# Patient Record
Sex: Male | Born: 1985 | Race: Black or African American | Hispanic: No | Marital: Married | Smoking: Former smoker
Health system: Southern US, Community
[De-identification: ages and names within clinical notes are randomized; demographics above are authoritative.]

## PROBLEM LIST (undated history)

## (undated) DIAGNOSIS — F4310 Post-traumatic stress disorder, unspecified: Secondary | ICD-10-CM

## (undated) DIAGNOSIS — I82409 Acute embolism and thrombosis of unspecified deep veins of unspecified lower extremity: Secondary | ICD-10-CM

## (undated) HISTORY — PX: OTHER SURGICAL HISTORY: SHX169

## (undated) HISTORY — PX: EXTERNAL EAR SURGERY: SHX627

## (undated) HISTORY — PX: TENDON REPAIR: SHX5111

---
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
## 2009-12-16 ENCOUNTER — Inpatient Hospital Stay (HOSPITAL_COMMUNITY): Admission: EM | Admit: 2009-12-16 | Discharge: 2009-12-17 | Payer: Self-pay | Admitting: Emergency Medicine

## 2010-01-23 ENCOUNTER — Encounter: Admission: RE | Admit: 2010-01-23 | Discharge: 2010-02-22 | Payer: Self-pay | Admitting: Orthopedic Surgery

## 2010-09-16 LAB — COMPREHENSIVE METABOLIC PANEL
Alkaline Phosphatase: 71 U/L (ref 39–117)
BUN: 12 mg/dL (ref 6–23)
Calcium: 9.6 mg/dL (ref 8.4–10.5)
Chloride: 105 mEq/L (ref 96–112)
Creatinine, Ser: 1 mg/dL (ref 0.4–1.5)
GFR calc Af Amer: >60 mL/min (ref >60)
Potassium: 3.2 mEq/L — ABNORMAL LOW (ref 3.5–5.1)

## 2010-09-16 LAB — CBC
HCT: 41.1 % (ref 39.0–52.0)
Hemoglobin: 14.2 g/dL (ref 13.0–17.0)
MCHC: 34.6 g/dL (ref 30.0–36.0)
RDW: 12.7 % (ref 11.5–15.5)
WBC: 6.3 10*3/uL (ref 4.0–10.5)

## 2010-09-16 LAB — POCT I-STAT, CHEM 8
BUN: 14 mg/dL (ref 6–23)
Calcium, Ion: 1.17 mmol/L (ref 1.12–1.32)
Chloride: 105 mEq/L (ref 96–112)
Glucose, Bld: 90 mg/dL (ref 70–99)
Hemoglobin: 13.9 g/dL (ref 13.0–17.0)
Sodium: 141 mEq/L (ref 135–145)

## 2011-07-10 ENCOUNTER — Ambulatory Visit (INDEPENDENT_AMBULATORY_CARE_PROVIDER_SITE_OTHER): Payer: BC Managed Care – PPO

## 2011-07-10 DIAGNOSIS — R05 Cough: Secondary | ICD-10-CM

## 2011-07-10 DIAGNOSIS — R509 Fever, unspecified: Secondary | ICD-10-CM

## 2011-07-10 DIAGNOSIS — R059 Cough, unspecified: Secondary | ICD-10-CM

## 2011-10-04 ENCOUNTER — Encounter (HOSPITAL_COMMUNITY): Payer: Self-pay | Admitting: *Deleted

## 2011-10-04 ENCOUNTER — Emergency Department (HOSPITAL_COMMUNITY)
Admission: EM | Admit: 2011-10-04 | Discharge: 2011-10-04 | Disposition: A | Discharge status: Home / Self Care | Payer: BC Managed Care – PPO | Attending: Emergency Medicine | Admitting: Emergency Medicine

## 2011-10-04 ENCOUNTER — Emergency Department (HOSPITAL_COMMUNITY): Payer: BC Managed Care – PPO

## 2011-10-04 DIAGNOSIS — R071 Chest pain on breathing: Secondary | ICD-10-CM | POA: Insufficient documentation

## 2011-10-04 DIAGNOSIS — R0789 Other chest pain: Secondary | ICD-10-CM

## 2011-10-04 MED ORDER — OXYCODONE-ACETAMINOPHEN 5-325 MG PO TABS: 1.0000 | ORAL_TABLET | Freq: Four times a day (QID) | ORAL | Status: AC | PRN

## 2011-10-04 MED ORDER — ONDANSETRON 8 MG PO TBDP: 8.0000 mg | ORAL_TABLET | Freq: Three times a day (TID) | ORAL | Status: AC | PRN

## 2011-10-04 MED ORDER — METAXALONE 800 MG PO TABS: 800.0000 mg | ORAL_TABLET | Freq: Three times a day (TID) | ORAL | Status: AC

## 2011-10-04 MED ORDER — OXYCODONE-ACETAMINOPHEN 5-325 MG PO TABS
1.0000 | ORAL_TABLET | Freq: Once | ORAL | Status: AC
  Administered 2011-10-04: 1 via ORAL
  Filled 2011-10-04: qty 1

## 2011-10-04 NOTE — Discharge Instructions (Signed)
Chest Wall Pain Chest wall pain is pain in or around the bones and muscles of your chest. It may take up to 6 weeks to get better. It may take longer if you must stay physically active in your work and activities.  CAUSES  Chest wall pain may happen on its own. However, it may be caused by:  A viral illness like the flu.   Injury.   Coughing.   Exercise.   Arthritis.   Fibromyalgia.   Shingles.  HOME CARE INSTRUCTIONS   Avoid overtiring physical activity. Try not to strain or perform activities that cause pain. This includes any activities using your chest or your abdominal and side muscles, especially if heavy weights are used.   Put ice on the sore area.   Put ice in a plastic bag.   Place a towel between your skin and the bag.   Leave the ice on for 15 to 20 minutes per hour while awake for the first 2 days.   Only take over-the-counter or prescription medicines for pain, discomfort, or fever as directed by your caregiver.  SEEK IMMEDIATE MEDICAL CARE IF:   Your pain increases, or you are very uncomfortable.   You have a fever.   Your chest pain becomes worse.   You have new, unexplained symptoms.   You have nausea or vomiting.   You feel sweaty or lightheaded.   You have a cough with phlegm (sputum), or you cough up blood.  MAKE SURE YOU:   Understand these instructions.   Will watch your condition.   Will get help right away if you are not doing well or get worse.  Document Released: 06/17/2005 Document Revised: 06/06/2011 Document Reviewed: 02/11/2011 [facility] Patient Information 2012 [place], [place]. Take medication as directed follow at the [place] as needed

## 2011-10-04 NOTE — ED Notes (Signed)
Pt returned from xray

## 2011-10-04 NOTE — ED Provider Notes (Addendum)
History     CSN: [id number]  Arrival date & time 10/04/11  0226   First MD Initiated Contact with Patient 10/04/11 [phone]      Chief Complaint  Patient presents with  . Chest Pain    Pain on Lt side of chest that increases with movement.     (Consider location/radiation/quality/duration/timing/severity/associated sxs/prior treatment) HPI Comments: Darrell [name] is a 26 year old, tall, thin gentleman for an acute onset of left rib pain.  Approximately 2 weeks ago.  That has been persistent, since he's been taking over-the-counter ibuprofen and muscle relaxers, that he's had left over from a previous injury without any relief.  The pain is sharp in nature, stabbing, made worse with breathing.  Certain positions make tolerable with splinting and being still  Patient is a 26 y.o. male presenting with chest pain. The history is provided by the patient.  Chest Pain The chest pain began 1 - 2 weeks ago. Chest pain occurs constantly. The chest pain is worsening. The pain is associated with breathing, lifting and exertion. At its most intense, the pain is at 9/10. The pain is currently at 8/10. The severity of the pain is moderate. The quality of the pain is described as sharp. The pain does not radiate. Pertinent negatives for primary symptoms include no fever, no cough, no nausea, no vomiting and no dizziness.     History reviewed. No pertinent past medical history.  History reviewed. No pertinent past surgical history.  History reviewed. No pertinent family history.  History  Substance Use Topics  . Smoking status: Not on file  . Smokeless tobacco: Not on file  . Alcohol Use: Not on file      Review of Systems  Constitutional: Negative for fever.  HENT: Negative for rhinorrhea.   Respiratory: Negative for cough.   Cardiovascular: Positive for chest pain.  Gastrointestinal: Negative for nausea and vomiting.  Genitourinary: Negative for flank pain.  Musculoskeletal: Negative for  myalgias and back pain.  Neurological: Negative for dizziness and headaches.    Allergies  Review of patient's allergies indicates no known allergies.  Home Medications   Current Outpatient Rx  Name Route Sig Dispense Refill  . IBUPROFEN 600 MG PO TABS Oral Take 600 mg by mouth every 6 (six) hours as needed. For pain    . PRESCRIPTION MEDICATION Oral Take 1 tablet by mouth once. Muscle relaxer for an old back injury-he has 3 different muscle relaxers, but not sure which one he took.    . SERTRALINE HCL 100 MG PO TABS Oral Take 100 mg by mouth 2 (two) times daily. For PTSD    . METAXALONE 800 MG PO TABS Oral Take 1 tablet (800 mg total) by mouth 3 (three) times daily. 21 tablet 0  . ONDANSETRON 8 MG PO TBDP Oral Take 1 tablet (8 mg total) by mouth every 8 (eight) hours as needed for nausea. 20 tablet 0  . OXYCODONE-ACETAMINOPHEN 5-325 MG PO TABS Oral Take 1 tablet by mouth every 6 (six) hours as needed for pain. 20 tablet 0    BP 116/81  Pulse 80  Temp(Src) 97.9 F (36.6 C) (Oral)  Resp 20  SpO2 97%  Physical Exam  Constitutional: He appears well-developed and well-nourished.  HENT:  Head: Normocephalic.  Eyes: Pupils are equal, round, and reactive to light.  Neck: Normal range of motion.  Cardiovascular: Normal rate and regular rhythm.   Pulmonary/Chest: Effort normal and breath sounds normal. He has no wheezes. He exhibits tenderness.  Abdominal: Soft.  Musculoskeletal: Normal range of motion.  Neurological: He is alert.    ED Course  Procedures (including critical care time)  Labs Reviewed - No data to display Dg Chest 2 View  10/04/2011  *RADIOLOGY REPORT*  Clinical Data: Left flank pain for 1 week.  Pain on breathing.  CHEST - 2 VIEW  Comparison: CT chest 12/16/2009  Findings: The heart size and pulmonary vascularity are normal. The lungs appear clear and expanded without focal air space disease or consolidation. No blunting of the costophrenic angles.  No pneumothorax.   IMPRESSION: No evidence of active pulmonary disease.  Original Report Authenticated By: Marlon Pel, M.D.     1. Left-sided chest wall pain       MDM  2 patient's physical stature, concerning for a spontaneous pneumothorax, will be evaluated by chest x-ray, although he is not tachycardic or tachypnea or hypoxic        Arman Filter, NP 10/04/11 0506  Arman Filter, NP 10/04/11 0506  Arman Filter, NP 12/03/11 9604

## 2011-10-04 NOTE — ED Provider Notes (Signed)
Medical screening examination/treatment/procedure(s) were performed by non-physician practitioner and as supervising physician I was immediately available for consultation/collaboration.   Olivia Mackie, MD 10/04/11 [phone]

## 2011-10-04 NOTE — ED Notes (Signed)
Rx x 1, pt voiced understanding to f/u with PCP. 

## 2011-10-04 NOTE — ED Notes (Signed)
Pt c/o ongoing left sided CP x 1 week, states the pain has increased each day.  Denies any known injury.  Pain increases with inspiration and palpation.  No crepitus/free air noted.  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

## 2011-10-04 NOTE — ED Notes (Signed)
PT reports Lt sided rib pain that is worse with breathing. Pt denies injury to area ,no bruising noted. Pt does report SHOB and increased pain with  Breathing. Pain is sharp .

## 2011-10-16 ENCOUNTER — Encounter (HOSPITAL_COMMUNITY): Payer: Self-pay

## 2011-10-16 ENCOUNTER — Emergency Department (HOSPITAL_COMMUNITY)
Admission: EM | Admit: 2011-10-16 | Discharge: 2011-10-16 | Disposition: A | Discharge status: Home / Self Care | Payer: BC Managed Care – PPO | Source: Home / Self Care | Attending: Family Medicine | Admitting: Family Medicine

## 2011-10-16 DIAGNOSIS — K612 Anorectal abscess: Secondary | ICD-10-CM

## 2011-10-16 DIAGNOSIS — K611 Rectal abscess: Secondary | ICD-10-CM

## 2011-10-16 HISTORY — DX: Post-traumatic stress disorder, unspecified: F43.10

## 2011-10-16 MED ORDER — SULFAMETHOXAZOLE-TRIMETHOPRIM 800-160 MG PO TABS: 1.0000 | ORAL_TABLET | Freq: Two times a day (BID) | ORAL | Status: AC

## 2011-10-16 NOTE — ED Notes (Signed)
rx for bactrim ds called in to [facility], [address] at pt request, spoke directly w pharmacist to inform of pt pending arrival

## 2011-10-16 NOTE — Discharge Instructions (Signed)
Warm compress twice a day when you take the antibiotic, take all of medicine, return as needed. °

## 2011-10-16 NOTE — ED Provider Notes (Signed)
History     CSN: [id number]  Arrival date & time 10/16/11  9629   First MD Initiated Contact with Patient 10/16/11 1932      Chief Complaint  Patient presents with  . Recurrent Skin Infections    (Consider location/radiation/quality/duration/timing/severity/associated sxs/prior treatment) Patient is a 26 y.o. male presenting with abscess. The history is provided by the patient.  Abscess  This is a new problem. The current episode started less than one week ago. The problem has been gradually worsening. The problem is moderate. The abscess is characterized by painfulness.    Past Medical History  Diagnosis Date  . PTSD (post-traumatic stress disorder)     History reviewed. No pertinent past surgical history.  History reviewed. No pertinent family history.  History  Substance Use Topics  . Smoking status: Not on file  . Smokeless tobacco: Not on file  . Alcohol Use:       Review of Systems  Constitutional: Negative.   Gastrointestinal: Positive for rectal pain.    Allergies  Review of patient's allergies indicates no known allergies.  Home Medications   Current Outpatient Rx  Name Route Sig Dispense Refill  . METRONIDAZOLE 250 MG PO TABS Oral Take 250 mg by mouth 3 (three) times daily.    . IBUPROFEN 600 MG PO TABS Oral Take 600 mg by mouth every 6 (six) hours as needed. For pain    . PRESCRIPTION MEDICATION Oral Take 1 tablet by mouth once. Muscle relaxer for an old back injury-he has 3 different muscle relaxers, but not sure which one he took.    . SERTRALINE HCL 100 MG PO TABS Oral Take 100 mg by mouth 2 (two) times daily. For PTSD    . SULFAMETHOXAZOLE-TRIMETHOPRIM 800-160 MG PO TABS Oral Take 1 tablet by mouth 2 (two) times daily. 20 tablet 0    BP 126/74  Pulse 104  Temp(Src) 99 F (37.2 C) (Oral)  Resp 16  SpO2 98%  Physical Exam  Nursing note and vitals reviewed. Constitutional: He appears well-developed and well-nourished.  Skin:   Perianal abscess    ED Course  INCISION AND DRAINAGE Date/Time: 10/16/2011 9:09 PM Performed by: Linna Hoff Authorized by: Bradd Canary D Consent: Verbal consent obtained. Consent given by: patient Type: abscess Body area: anogenital Anesthesia: local infiltration Local anesthetic: lidocaine 2% without epinephrine and topical anesthetic Patient sedated: no Scalpel size: 11 Incision type: single straight Complexity: simple Drainage: purulent Drainage amount: copious Wound treatment: wound left open Patient tolerance: Patient tolerated the procedure well with no immediate complications.   (including critical care time)  Labs Reviewed - No data to display No results found.   1. Perirectal abscess       MDM          Linna Hoff, MD 10/23/11 1911

## 2011-10-16 NOTE — ED Notes (Signed)
Has boil on his buttocks x past 5 days

## 2011-10-28 ENCOUNTER — Emergency Department (HOSPITAL_COMMUNITY)
Admission: EM | Admit: 2011-10-28 | Discharge: 2011-10-28 | Disposition: A | Discharge status: Home / Self Care | Payer: BC Managed Care – PPO | Source: Home / Self Care | Attending: Family Medicine | Admitting: Family Medicine

## 2011-10-28 ENCOUNTER — Encounter (HOSPITAL_COMMUNITY): Payer: Self-pay | Admitting: Emergency Medicine

## 2011-10-28 ENCOUNTER — Emergency Department (INDEPENDENT_AMBULATORY_CARE_PROVIDER_SITE_OTHER): Payer: BC Managed Care – PPO

## 2011-10-28 DIAGNOSIS — S6390XA Sprain of unspecified part of unspecified wrist and hand, initial encounter: Secondary | ICD-10-CM

## 2011-10-28 DIAGNOSIS — S63619A Unspecified sprain of unspecified finger, initial encounter: Secondary | ICD-10-CM

## 2011-10-28 MED ORDER — HYDROCODONE-ACETAMINOPHEN 5-325 MG PO TABS: ORAL_TABLET | ORAL | Status: AC

## 2011-10-28 MED ORDER — NAPROXEN 375 MG PO TABS: 375.0000 mg | ORAL_TABLET | Freq: Two times a day (BID) | ORAL | Status: AC

## 2011-10-28 NOTE — ED Provider Notes (Signed)
History     CSN: [id number]  Arrival date & time 10/28/11  1000   First MD Initiated Contact with Patient 10/28/11 1107      Chief Complaint  Patient presents with  . Finger Injury    (Consider location/radiation/quality/duration/timing/severity/associated sxs/prior treatment) HPI Comments: Smashed hand between liftgate of truck and backhoe while at work, last week, a week ago, has not sought treatment; pain and swelling in index and middle fingers of RIGHT hand; difficulty with bending index finger, but minimal discomfort in middle finger; xray negative for fracture; buddy taped fingers and referred to [facility] if no improvement in one week; given restrictions and work note; given rx for naproxen and PRN hydrocodone Patient is a 26 y.o. male presenting with hand injury. The history is provided by the patient.  Hand Injury  The incident occurred more than 1 week ago. The incident occurred at work. The injury mechanism was a direct blow. The pain is present in the right fingers. The quality of the pain is described as sharp and throbbing. The pain is moderate. The pain has been constant since the incident. He reports no foreign bodies present. The symptoms are aggravated by movement, palpation and use. He has tried nothing for the symptoms. The treatment provided no relief.    Past Medical History  Diagnosis Date  . PTSD (post-traumatic stress disorder)     History reviewed. No pertinent past surgical history.  No family history on file.  History  Substance Use Topics  . Smoking status: Never Smoker   . Smokeless tobacco: Not on file  . Alcohol Use: Yes      Review of Systems  Constitutional: Negative.   HENT: Negative.   Eyes: Negative.   Respiratory: Negative.   Cardiovascular: Negative.   Gastrointestinal: Negative.   Genitourinary: Negative.   Musculoskeletal:       RIGHT hand pain in 2nd and 3rd fingers  Skin: Negative.   Neurological: Negative.     Allergies    Review of patient's allergies indicates no known allergies.  Home Medications   Current Outpatient Rx  Name Route Sig Dispense Refill  . HYDROCODONE-ACETAMINOPHEN 5-325 MG PO TABS  Take one to two tablets every 4 to 6 hours as needed for pain 20 tablet 0  . IBUPROFEN 600 MG PO TABS Oral Take 600 mg by mouth every 6 (six) hours as needed. For pain    . METRONIDAZOLE 250 MG PO TABS Oral Take 250 mg by mouth 3 (three) times daily.    Marland Kitchen NAPROXEN 375 MG PO TABS Oral Take 1 tablet (375 mg total) by mouth 2 (two) times daily. 20 tablet 0  . PRESCRIPTION MEDICATION Oral Take 1 tablet by mouth once. Muscle relaxer for an old back injury-he has 3 different muscle relaxers, but not sure which one he took.    . SERTRALINE HCL 100 MG PO TABS Oral Take 100 mg by mouth 2 (two) times daily. For PTSD      BP 117/64  Pulse 64  Temp(Src) 97.6 F (36.4 C) (Oral)  Resp 12  SpO2 100%  Physical Exam  Nursing note and vitals reviewed. Constitutional: He is oriented to person, place, and time. He appears well-developed and well-nourished.  HENT:  Head: Normocephalic and atraumatic.  Eyes: EOM are normal.  Neck: Normal range of motion.  Pulmonary/Chest: Effort normal.  Musculoskeletal:       Right hand: He exhibits decreased range of motion, tenderness, bony tenderness and swelling. He exhibits normal capillary  refill and no laceration. normal sensation noted.       Hands:      Pain and swelling over PIP and DIP joints of 2nd and 3rd fingers of RIGHT hand, with decreased flexion of index finger, and limited flexion of middle finger  Neurological: He is alert and oriented to person, place, and time.  Skin: Skin is warm and dry.  Psychiatric: His behavior is normal.    ED Course  Procedures (including critical care time)  Labs Reviewed - No data to display Dg Hand Complete Right  10/28/2011  *RADIOLOGY REPORT*  Clinical Data: Crush injury  RIGHT HAND - COMPLETE 3+ VIEW  Comparison: 12/16/2009   Findings: Three views of the right hand submitted.  No acute fracture or subluxation.  No radiopaque foreign body.  IMPRESSION: No acute fracture or subluxation.  Original Report Authenticated By: Natasha Mead, M.D.     1. Finger sprain       MDM  Xray reviewed by radiologist and myself; no acute findings, no fracture; buddy taped, given hydrocodone PRN, and referred to Hand Specialist for follow up if no improvement        Renaee Munda, MD 10/31/11 1322

## 2011-10-28 NOTE — Discharge Instructions (Signed)
Your x-ray was negative for any fracture, dislocation, or other acute finding. Keep fingers buddy-taped with activity over the next week. Limit use of the right hand as discussed. Take medications as prescribed. If your symptoms are not better in one week, please call the orthopedic specialist, the hand surgeon, listed on your discharge papers. Return to care sooner should you experience any numbness, tingling, or loss of grip.

## 2011-10-28 NOTE — ED Notes (Signed)
HERE WITH RIGHT HAND INDEX FINGER SWELLING AND BRUISING RADIATING TO MIDDLE FINGER AFTER DUMP TRUCK LATCH GOT CAUGHT ON IT LAST Monday.PT TRIED TO TREAT AT HOME WITH SPLINT AND BUDDY TAPE.UNABLE TO BEND INDEX FINGER BUT DENIES PAIN.OLD ABRASION SEEN TO MIDDLE AND PALM OF HAND.PT TAKING IBUPROFEN FOR PAIN

## 2011-12-04 NOTE — ED Provider Notes (Signed)
Medical screening examination/treatment/procedure(s) were performed by non-physician practitioner and as supervising physician I was immediately available for consultation/collaboration.  Olivia Mackie, MD 12/04/11 [phone]

## 2013-04-16 ENCOUNTER — Encounter (HOSPITAL_BASED_OUTPATIENT_CLINIC_OR_DEPARTMENT_OTHER): Payer: Self-pay | Admitting: Emergency Medicine

## 2013-04-16 ENCOUNTER — Other Ambulatory Visit (HOSPITAL_BASED_OUTPATIENT_CLINIC_OR_DEPARTMENT_OTHER): Payer: Self-pay | Admitting: Internal Medicine

## 2013-04-16 ENCOUNTER — Emergency Department (HOSPITAL_BASED_OUTPATIENT_CLINIC_OR_DEPARTMENT_OTHER)
Admission: EM | Admit: 2013-04-16 | Discharge: 2013-04-16 | Disposition: A | Discharge status: Home / Self Care | Payer: BC Managed Care – PPO | Attending: Emergency Medicine | Admitting: Emergency Medicine

## 2013-04-16 ENCOUNTER — Ambulatory Visit (HOSPITAL_BASED_OUTPATIENT_CLINIC_OR_DEPARTMENT_OTHER)
Admission: RE | Admit: 2013-04-16 | Discharge: 2013-04-16 | Disposition: A | Discharge status: Home / Self Care | Payer: BC Managed Care – PPO | Source: Ambulatory Visit | Attending: Internal Medicine | Admitting: Internal Medicine

## 2013-04-16 DIAGNOSIS — F4310 Post-traumatic stress disorder, unspecified: Secondary | ICD-10-CM | POA: Insufficient documentation

## 2013-04-16 DIAGNOSIS — Z79899 Other long term (current) drug therapy: Secondary | ICD-10-CM | POA: Insufficient documentation

## 2013-04-16 DIAGNOSIS — R52 Pain, unspecified: Secondary | ICD-10-CM

## 2013-04-16 DIAGNOSIS — I82402 Acute embolism and thrombosis of unspecified deep veins of left lower extremity: Secondary | ICD-10-CM

## 2013-04-16 DIAGNOSIS — F17200 Nicotine dependence, unspecified, uncomplicated: Secondary | ICD-10-CM | POA: Insufficient documentation

## 2013-04-16 DIAGNOSIS — I82409 Acute embolism and thrombosis of unspecified deep veins of unspecified lower extremity: Secondary | ICD-10-CM | POA: Insufficient documentation

## 2013-04-16 LAB — BASIC METABOLIC PANEL
BUN: 12 mg/dL (ref 6–23)
CO2: 27 mEq/L (ref 19–32)
Calcium: 9.9 mg/dL (ref 8.4–10.5)
Chloride: 102 mEq/L (ref 96–112)
GFR calc Af Amer: >90 mL/min (ref >90)
GFR calc non Af Amer: >90 mL/min (ref >90)
Sodium: 141 mEq/L (ref 135–145)

## 2013-04-16 LAB — CBC WITH DIFFERENTIAL/PLATELET
Basophils Relative: 0 % (ref 0–1)
Eosinophils Absolute: 0.3 10*3/uL (ref 0.0–0.7)
HCT: 37.2 % — ABNORMAL LOW (ref 39.0–52.0)
Hemoglobin: 13.3 g/dL (ref 13.0–17.0)
MCH: 30.4 pg (ref 26.0–34.0)
MCHC: 35.8 g/dL (ref 30.0–36.0)
MCV: 84.9 fL (ref 78.0–100.0)
Monocytes Absolute: 0.5 10*3/uL (ref 0.1–1.0)
Monocytes Relative: 7 % (ref 3–12)
Neutro Abs: 4.9 10*3/uL (ref 1.7–7.7)
RBC: 4.38 MIL/uL (ref 4.22–5.81)

## 2013-04-16 MED ORDER — RIVAROXABAN 15 MG PO TABS: 15.0000 mg | ORAL_TABLET | Freq: Two times a day (BID) | ORAL | Status: DC

## 2013-04-16 MED ORDER — RIVAROXABAN 15 MG PO TABS
15.0000 mg | ORAL_TABLET | Freq: Every day | ORAL | Status: DC
  Filled 2013-04-16: qty 1

## 2013-04-16 MED ORDER — RIVAROXABAN 20 MG PO TABS
20.0000 mg | ORAL_TABLET | Freq: Once | ORAL | Status: DC
  Filled 2013-04-16: qty 1

## 2013-04-16 NOTE — ED Provider Notes (Signed)
CSN: [id number]     Arrival date & time 04/16/13  1823 History  This chart was scribed for Rolan Bucco, MD by Danella Maiers, ED Scribe. This patient was seen in room MH02/MH02 and the patient's care was started at 6:29 PM.   Chief Complaint  Patient presents with  . Leg Pain  . DVT   Patient is a 27 y.o. male presenting with leg pain. The history is provided by the patient. No language interpreter was used.  Leg Pain Location:  Leg Time since incident:  4 days Injury: no   Leg location:  L leg Pain details:    Quality:  Throbbing Associated symptoms: no back pain, no fatigue and no fever    HPI Comments: Darrell [name] is a 27 y.o. male who presents to the Emergency Department complaining of constant, throbbing left leg pain onset 4 days ago. He states it started out in his thigh and spread to the calf. He denies swelling. Pt was sent here for evaluation after outpatient US showed positive DVT. He is a [employer] and so he spends large amounts of time sitting. He denies CP, dyspnea, fevers, SOB. He denies h/o DVT.  He does have a FH of DVT in his biological father.  Past Medical History  Diagnosis Date  . PTSD (post-traumatic stress disorder)    Past Surgical History  Procedure Laterality Date  . Arm surgery    . Tendon repair    . External ear surgery     No family history on file. History  Substance Use Topics  . Smoking status: Current Every Day Smoker    Types: Cigarettes  . Smokeless tobacco: Not on file  . Alcohol Use: Yes     Comment: 1-2 x weekly    Review of Systems  Constitutional: Negative for fever, chills, diaphoresis and fatigue.  HENT: Negative for congestion, rhinorrhea and sneezing.   Eyes: Negative.   Respiratory: Negative for cough, chest tightness and shortness of breath.   Cardiovascular: Negative for chest pain and leg swelling.  Gastrointestinal: Negative for nausea, vomiting, abdominal pain, diarrhea and blood in stool.   Genitourinary: Negative for frequency, hematuria, flank pain and difficulty urinating.  Musculoskeletal: Positive for myalgias (left leg). Negative for arthralgias and back pain.  Skin: Negative for rash.  Neurological: Negative for dizziness, speech difficulty, weakness, numbness and headaches.  All other systems reviewed and are negative.    Allergies  Review of patient's allergies indicates no known allergies.  Home Medications   Current Outpatient Rx  Name  Route  Sig  Dispense  Refill  . Rivaroxaban (XARELTO) 15 MG TABS tablet   Oral   Take 1 tablet (15 mg total) by mouth 2 (two) times daily.   42 tablet   0   . sertraline (ZOLOFT) 100 MG tablet   Oral   Take 100 mg by mouth 2 (two) times daily. For PTSD          BP 140/89  Pulse 72  Resp 16  Ht 6\' 5"  (1.956 m)  Wt 184 lb (83.462 kg)  BMI 21.81 kg/m2  SpO2 100% Physical Exam  Nursing note and vitals reviewed. Constitutional: He is oriented to person, place, and time. He appears well-developed and well-nourished.  HENT:  Head: Normocephalic and atraumatic.  Eyes: Pupils are equal, round, and reactive to light.  Neck: Normal range of motion. Neck supple.  Cardiovascular: Normal rate, regular rhythm and normal heart sounds.   Pulmonary/Chest: Effort normal and  breath sounds normal. No respiratory distress. He has no wheezes. He has no rales. He exhibits no tenderness.  Abdominal: Soft. Bowel sounds are normal. There is no tenderness. There is no rebound and no guarding.  Musculoskeletal: Normal range of motion. He exhibits no edema.  No swelling to left leg but some tenderness to posterior knee.  Lymphadenopathy:    He has no cervical adenopathy.  Neurological: He is alert and oriented to person, place, and time.  Skin: Skin is warm and dry. No rash noted.  Psychiatric: He has a normal mood and affect.    ED Course  Procedures (including critical care time) Medications  Rivaroxaban (XARELTO) tablet 15 mg  (not administered)    DIAGNOSTIC STUDIES: Oxygen Saturation is 100% on RA, normal by my interpretation.    COORDINATION OF CARE: 6:36 PM- Discussed treatment plan with pt which includes blood work. Pt agrees to plan.    Labs Review Labs Reviewed  CBC WITH DIFFERENTIAL - Abnormal; Notable for the following:    HCT 37.2 (*)    Platelets 131 (*)    All other components within normal limits  BASIC METABOLIC PANEL - Abnormal; Notable for the following:    Potassium 3.3 (*)    Glucose, Bld 115 (*)    All other components within normal limits   Imaging Review US Venous Img Lower Unilateral Left  04/16/2013   CLINICAL DATA:  Left leg pain and soreness.  EXAM: VENOUS DOPPLER ULTRASOUND OF LEFT LOWER EXTREMITY  TECHNIQUE: Gray-scale sonography with graded compression, as well as color Doppler and duplex ultrasound, were performed to evaluate the deep venous system from the level of the common femoral vein through the popliteal and proximal calf veins. Spectral Doppler was utilized to evaluate flow at rest and with distal augmentation maneuvers.  COMPARISON:  None.  FINDINGS: Occlusive thrombus is seen in the profunda femoris and in the superficial femoral and popliteal veins. Clot is also seen in calf veins extending into the lower calf veins. The common femoral vein is patent.  IMPRESSION: Study is positive for DVT.   Electronically Signed   By: Drusilla Kanner M.D.   On: 04/16/2013 18:14    EKG Interpretation   None       MDM   1. DVT (deep venous thrombosis), left    Patient with positive DVT. He has no evidence of pulmonary embolus. He was started on xarelto and advised to followup with his primary care physician on Monday.     I personally performed the services described in this documentation, which was scribed in my presence.  The recorded information has been reviewed and considered.     Rolan Bucco, MD 04/16/13 2007

## 2013-04-16 NOTE — ED Notes (Signed)
Sent to ED for evaluation after outpatient ultrasound.  Results indicate DVT in left leg.

## 2013-04-19 ENCOUNTER — Emergency Department (HOSPITAL_COMMUNITY)
Admission: EM | Admit: 2013-04-19 | Discharge: 2013-04-19 | Disposition: A | Discharge status: Home / Self Care | Payer: BC Managed Care – PPO | Attending: Emergency Medicine | Admitting: Emergency Medicine

## 2013-04-19 ENCOUNTER — Encounter (HOSPITAL_COMMUNITY): Payer: Self-pay | Admitting: Emergency Medicine

## 2013-04-19 DIAGNOSIS — Z7982 Long term (current) use of aspirin: Secondary | ICD-10-CM | POA: Insufficient documentation

## 2013-04-19 DIAGNOSIS — F4310 Post-traumatic stress disorder, unspecified: Secondary | ICD-10-CM | POA: Insufficient documentation

## 2013-04-19 DIAGNOSIS — Z87891 Personal history of nicotine dependence: Secondary | ICD-10-CM | POA: Insufficient documentation

## 2013-04-19 DIAGNOSIS — Z7901 Long term (current) use of anticoagulants: Secondary | ICD-10-CM | POA: Insufficient documentation

## 2013-04-19 DIAGNOSIS — R1110 Vomiting, unspecified: Secondary | ICD-10-CM | POA: Insufficient documentation

## 2013-04-19 DIAGNOSIS — Z79899 Other long term (current) drug therapy: Secondary | ICD-10-CM | POA: Insufficient documentation

## 2013-04-19 DIAGNOSIS — Z86718 Personal history of other venous thrombosis and embolism: Secondary | ICD-10-CM | POA: Insufficient documentation

## 2013-04-19 HISTORY — DX: Acute embolism and thrombosis of unspecified deep veins of unspecified lower extremity: I82.409

## 2013-04-19 LAB — CBC WITH DIFFERENTIAL/PLATELET
Basophils Absolute: 0 10*3/uL (ref 0.0–0.1)
Eosinophils Absolute: 0.1 10*3/uL (ref 0.0–0.7)
Eosinophils Relative: 3 % (ref 0–5)
Hemoglobin: 12.7 g/dL — ABNORMAL LOW (ref 13.0–17.0)
Lymphocytes Relative: 20 % (ref 12–46)
Lymphs Abs: 1.1 10*3/uL (ref 0.7–4.0)
MCV: 85.5 fL (ref 78.0–100.0)
Monocytes Absolute: 0.5 10*3/uL (ref 0.1–1.0)
Monocytes Relative: 8 % (ref 3–12)
Neutrophils Relative %: 69 % (ref 43–77)
Platelets: 173 10*3/uL (ref 150–400)
RBC: 4.33 MIL/uL (ref 4.22–5.81)
RDW: 11.8 % (ref 11.5–15.5)
WBC: 5.6 10*3/uL (ref 4.0–10.5)

## 2013-04-19 LAB — PROTIME-INR
INR: 2.09 — ABNORMAL HIGH (ref 0.00–1.49)
Prothrombin Time: 22.8 seconds — ABNORMAL HIGH (ref 11.6–15.2)

## 2013-04-19 NOTE — ED Provider Notes (Signed)
CSN: [id number]     Arrival date & time 04/19/13  0912 History   First MD Initiated Contact with Patient 04/19/13 [phone]     Chief Complaint  Patient presents with  . Hematemesis   (Consider location/radiation/quality/duration/timing/severity/associated sxs/prior Treatment) Patient is a 27 y.o. male presenting with vomiting. The history is provided by the patient. No language interpreter was used.  Emesis Severity:  Mild Duration:  1 day Number of daily episodes:  1 Emesis appearance: blood mixed with food. Able to tolerate:  Liquids and solids Progression:  Improving Recent urination:  Normal Context: not post-tussive and not self-induced   Associated symptoms: no abdominal pain, no chills, no cough, no diarrhea and no fever   Risk factors: no alcohol use, no sick contacts and no suspect food intake    Pt is a 27 year old male who presents today with history of one episode of vomiting that contained blood. He reports that he tasted blood in his mouth and that prompted him to vomit. He denies any abdominal pain or discomfort at today's visit. No previous history of GI related problems. He denies any nausea, diarrhea or blood in his stool. He reports that he has been taking blood thinner for a recent diagnosis of DVT. He reports that he has a follow-up appt for Thursday for the same.   Past Medical History  Diagnosis Date  . PTSD (post-traumatic stress disorder)    Past Surgical History  Procedure Laterality Date  . Arm surgery    . Tendon repair    . External ear surgery     History reviewed. No pertinent family history. History  Substance Use Topics  . Smoking status: Former Smoker    Types: Cigarettes  . Smokeless tobacco: Not on file  . Alcohol Use: Yes     Comment: 1-2 x weekly    Review of Systems  Constitutional: Negative for fever and chills.  Respiratory: Negative for chest tightness.   Cardiovascular: Negative for chest pain.  Gastrointestinal: Positive for  vomiting. Negative for nausea, abdominal pain and diarrhea.  Neurological: Negative for weakness.    Allergies  Review of patient's allergies indicates no known allergies.  Home Medications   Current Outpatient Rx  Name  Route  Sig  Dispense  Refill  . amitriptyline (ELAVIL) 100 MG tablet   Oral   Take 100 mg by mouth 2 (two) times daily.         Marland Kitchen aspirin 81 MG chewable tablet   Oral   Chew 81 mg by mouth daily.         . cyclobenzaprine (FLEXERIL) 5 MG tablet   Oral   Take 5 mg by mouth 3 (three) times daily as needed for muscle spasms.         Marland Kitchen FIBER SELECT GUMMIES PO   Oral   Take 1 tablet by mouth daily.         Marland Kitchen HYDROcodone-ibuprofen (VICOPROFEN) 7.5-200 MG per tablet   Oral   Take 1 tablet by mouth every 8 (eight) hours as needed for pain.         . Rivaroxaban (XARELTO) 15 MG TABS tablet   Oral   Take 1 tablet (15 mg total) by mouth 2 (two) times daily.   42 tablet   0    BP 124/64  Pulse 80  Temp(Src) 98.7 F (37.1 C) (Oral)  Resp 20  SpO2 100% Physical Exam  Nursing note and vitals reviewed. Constitutional: He is oriented to  person, place, and time. He appears well-developed and well-nourished. No distress.  HENT:  Head: Normocephalic and atraumatic.  Mouth/Throat: Oropharynx is clear and moist.  Eyes: Conjunctivae are normal. Pupils are equal, round, and reactive to light.  Neck: Normal range of motion. Neck supple. No JVD present. No thyromegaly present.  Cardiovascular: Normal rate, regular rhythm, normal heart sounds and intact distal pulses.   Pulmonary/Chest: Effort normal and breath sounds normal. No respiratory distress. He has no wheezes. He exhibits no tenderness.  Abdominal: Soft. Normal appearance and bowel sounds are normal. He exhibits no distension. There is no hepatosplenomegaly. There is no tenderness. There is no rebound and no CVA tenderness.  Musculoskeletal: Normal range of motion.  Lymphadenopathy:    He has no  cervical adenopathy.  Neurological: He is alert and oriented to person, place, and time.  Skin: Skin is warm and dry.  Psychiatric: He has a normal mood and affect. His behavior is normal. Judgment and thought content normal.    ED Course  Procedures (including critical care time) Labs Review Labs Reviewed - No data to display Imaging Review No results found.  EKG Interpretation   None       MDM   1. Emesis      Pt has been taking Xarelto for the last 2-3 days for a recently dx DVT. He had one episode of vomiting at home. Subjective bloody vomitus. Hgb 12.7, HCT 37.0. PT 22.8, INR 2.09. Well-appearing here for today's visit. No abdominal pain or blood in stool. Self-monitor for blood in stool or further vomiting. Follow-up appt for DVT management on Thursday, keep appt.     Irish Elders, NP 04/19/13 1147  Irish Elders, NP 04/19/13 1150

## 2013-04-19 NOTE — ED Notes (Signed)
Pt undressed, in gown, on continuous pulse oximetry and blood pressure cuff; family at bedside 

## 2013-04-19 NOTE — ED Notes (Signed)
To ED for eval after vomiting blood through the night. Pt put on xarelto 15mg  bid on 10/17 for DVT. Denies abd pain. Denies having any stools to check for bleeding. No SOB. Skin w/d, resp e/u. Pt ambulatory.

## 2013-04-19 NOTE — ED Provider Notes (Signed)
Medical screening examination/treatment/procedure(s) were performed by non-physician practitioner and as supervising physician I was immediately available for consultation/collaboration.  Flint Melter, MD 04/19/13 [phone]

## 2014-07-27 ENCOUNTER — Ambulatory Visit
Admission: RE | Admit: 2014-07-27 | Discharge: 2014-07-27 | Disposition: A | Discharge status: Home / Self Care | Payer: Commercial Managed Care - PPO | Source: Ambulatory Visit | Attending: Family Medicine | Admitting: Family Medicine

## 2014-07-27 ENCOUNTER — Other Ambulatory Visit: Payer: Self-pay | Admitting: Family Medicine

## 2014-07-27 DIAGNOSIS — R053 Chronic cough: Secondary | ICD-10-CM

## 2014-07-27 DIAGNOSIS — R05 Cough: Secondary | ICD-10-CM

## 2015-11-28 ENCOUNTER — Ambulatory Visit
Admission: RE | Admit: 2015-11-28 | Discharge: 2015-11-28 | Disposition: A | Discharge status: Home / Self Care | Payer: No Typology Code available for payment source | Source: Ambulatory Visit | Attending: Family Medicine | Admitting: Family Medicine

## 2015-11-28 ENCOUNTER — Other Ambulatory Visit: Payer: Self-pay | Admitting: Family Medicine

## 2015-11-28 DIAGNOSIS — M79672 Pain in left foot: Secondary | ICD-10-CM

## 2016-07-20 IMAGING — CR DG CHEST 2V
2 series · 2 of 2 positions shown · non-contrast
Comparison: 10/04/2011

CLINICAL DATA: Cough.  High fever.

EXAM:
CHEST  2 VIEW

[view not recorded (1 of 2)]
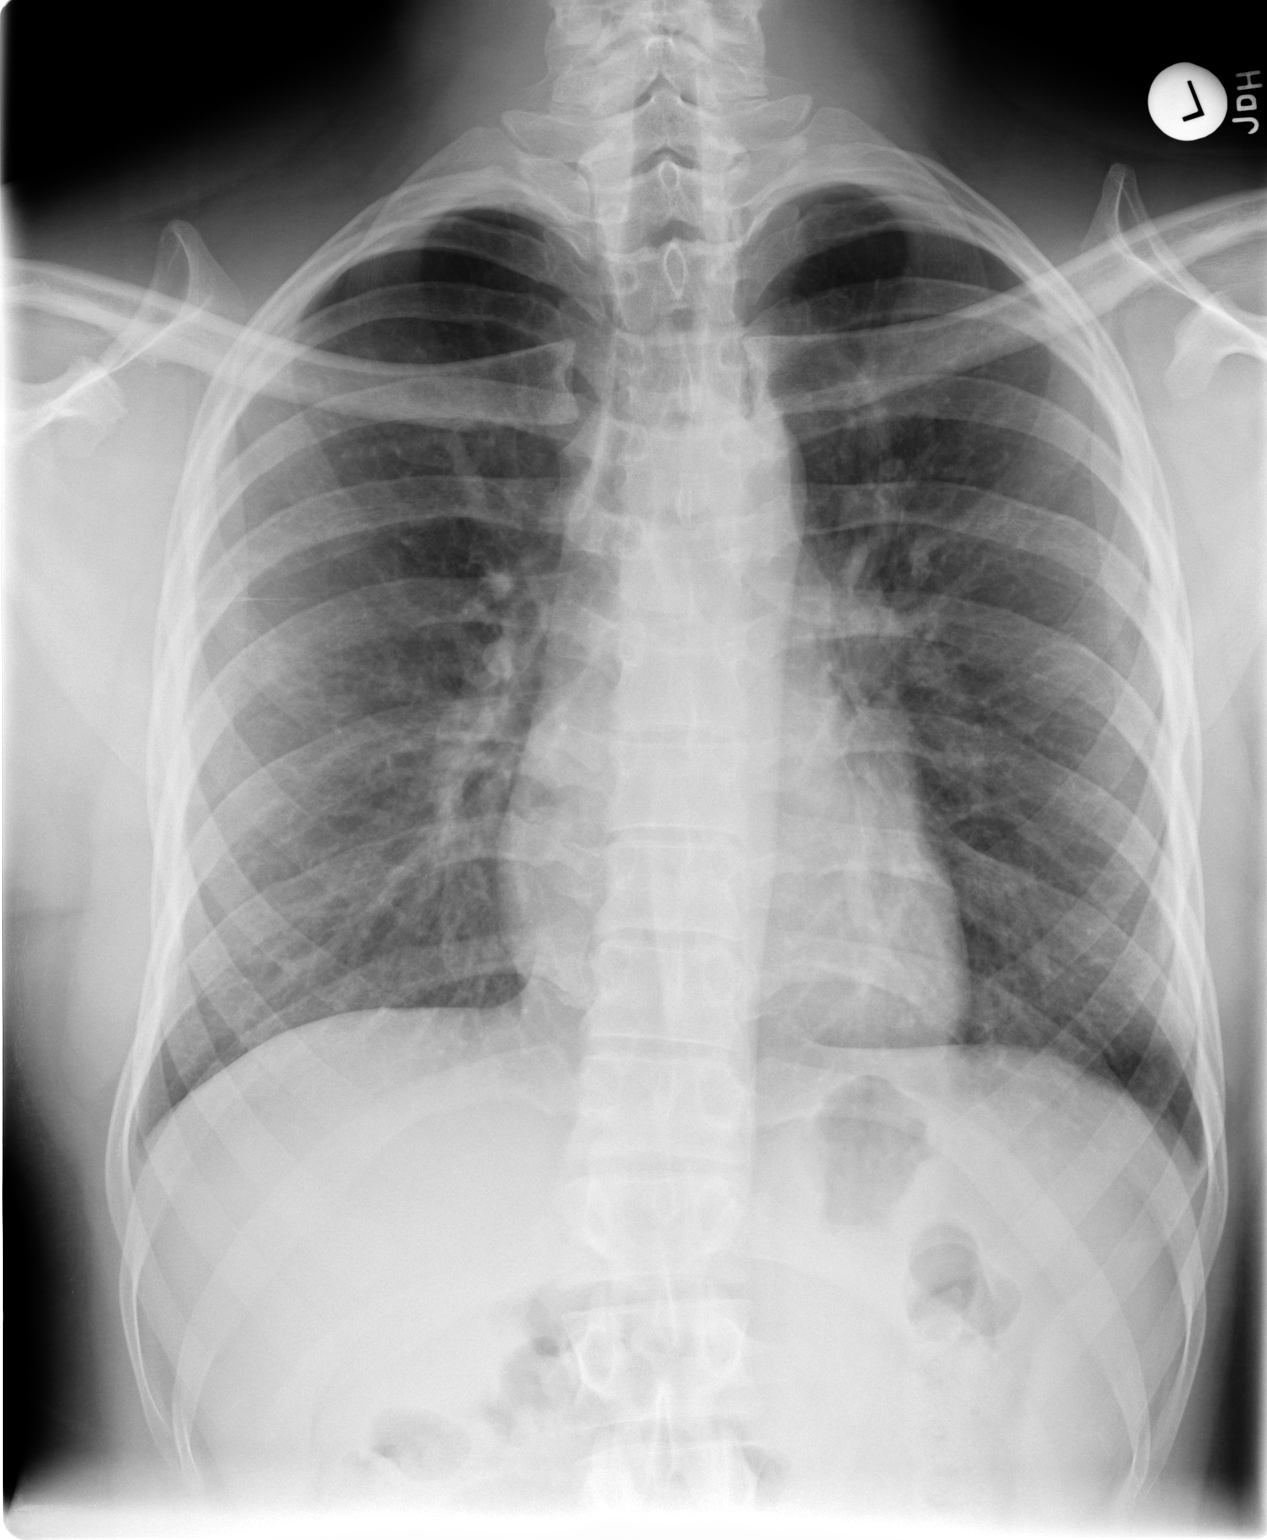

[view not recorded (2 of 2)]
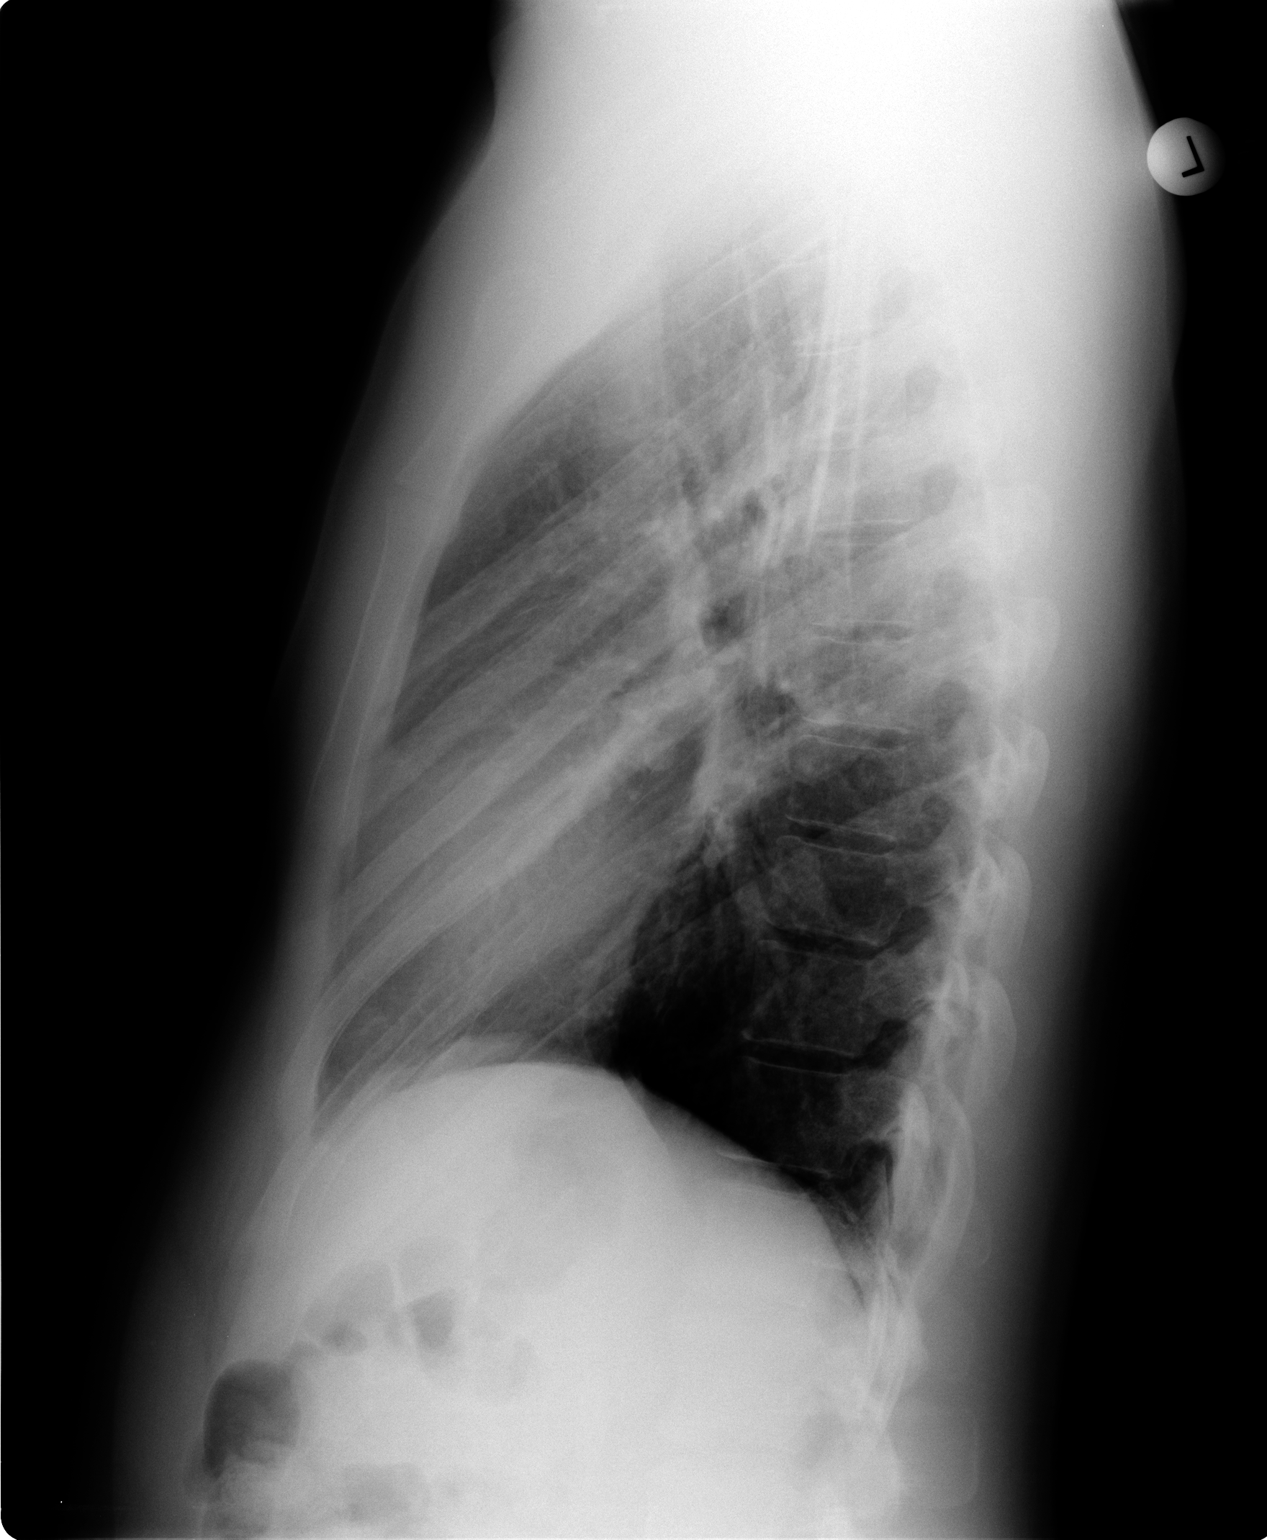

[2 of 2 positions shown; findings below may reference images not displayed]

FINDINGS: The heart size and mediastinal contours are within normal limits.
Both lungs are clear. The visualized skeletal structures are
unremarkable.
IMPRESSION: No active cardiopulmonary disease.

## 2016-07-22 ENCOUNTER — Ambulatory Visit (INDEPENDENT_AMBULATORY_CARE_PROVIDER_SITE_OTHER): Payer: Self-pay | Admitting: Urgent Care

## 2016-07-22 ENCOUNTER — Encounter: Payer: Self-pay | Admitting: Urgent Care

## 2016-07-22 VITALS — BP 122/80 | HR 68 | Temp 98.1°F | Resp 16 | Ht 75.0 in | Wt 202.0 lb

## 2016-07-22 DIAGNOSIS — Z9889 Other specified postprocedural states: Secondary | ICD-10-CM

## 2016-07-22 DIAGNOSIS — Z024 Encounter for examination for driving license: Secondary | ICD-10-CM
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
## 2016-07-22 NOTE — Patient Instructions (Addendum)

## 2016-12-22 ENCOUNTER — Emergency Department (HOSPITAL_BASED_OUTPATIENT_CLINIC_OR_DEPARTMENT_OTHER)
Admit: 2016-12-22 | Discharge: 2016-12-22 | Disposition: A | Discharge status: Home / Self Care | Payer: 59 | Attending: Emergency Medicine | Admitting: Emergency Medicine

## 2016-12-22 ENCOUNTER — Encounter (HOSPITAL_COMMUNITY): Payer: Self-pay | Admitting: Emergency Medicine

## 2016-12-22 ENCOUNTER — Emergency Department (HOSPITAL_COMMUNITY)
Admission: EM | Admit: 2016-12-22 | Discharge: 2016-12-22 | Disposition: A | Discharge status: Home / Self Care | Payer: 59 | Attending: Emergency Medicine | Admitting: Emergency Medicine

## 2016-12-22 DIAGNOSIS — I82442 Acute embolism and thrombosis of left tibial vein: Secondary | ICD-10-CM

## 2016-12-22 DIAGNOSIS — Z7982 Long term (current) use of aspirin: Secondary | ICD-10-CM | POA: Insufficient documentation

## 2016-12-22 DIAGNOSIS — I82812 Embolism and thrombosis of superficial veins of left lower extremities: Secondary | ICD-10-CM | POA: Insufficient documentation

## 2016-12-22 DIAGNOSIS — Z87891 Personal history of nicotine dependence: Secondary | ICD-10-CM | POA: Diagnosis not present

## 2016-12-22 DIAGNOSIS — M7989 Other specified soft tissue disorders: Secondary | ICD-10-CM

## 2016-12-22 DIAGNOSIS — M79609 Pain in unspecified limb: Secondary | ICD-10-CM

## 2016-12-22 LAB — I-STAT CHEM 8, ED
BUN: 10 mg/dL (ref 6–20)
Calcium, Ion: 1.27 mmol/L (ref 1.15–1.40)
Chloride: 101 mmol/L (ref 101–111)
Creatinine, Ser: 0.9 mg/dL (ref 0.61–1.24)
GLUCOSE: 96 mg/dL (ref 65–99)
HCT: 42 % (ref 39.0–52.0)
HEMOGLOBIN: 14.3 g/dL (ref 13.0–17.0)
Potassium: 3.6 mmol/L (ref 3.5–5.1)
Sodium: 141 mmol/L (ref 135–145)
TCO2: 29 mmol/L (ref 0–100)

## 2016-12-22 MED ORDER — RIVAROXABAN (XARELTO) VTE STARTER PACK (15 & 20 MG): 15.0000 mg | ORAL_TABLET | Freq: Two times a day (BID) | ORAL | 0 refills | Status: DC

## 2016-12-22 MED ORDER — RIVAROXABAN (XARELTO) VTE STARTER PACK (15 & 20 MG): ORAL_TABLET | ORAL | 0 refills | Status: AC

## 2016-12-22 MED ORDER — RIVAROXABAN 15 MG PO TABS
15.0000 mg | ORAL_TABLET | Freq: Once | ORAL | Status: AC
  Administered 2016-12-22: 15 mg via ORAL
  Filled 2016-12-22: qty 1

## 2016-12-22 NOTE — ED Triage Notes (Addendum)
Pt reports L calf pain and swelling for the past 3 days. Hx of DVT, feels like same. No SOB or CP. Not on blood thinners.
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
## 2016-12-22 NOTE — Progress Notes (Addendum)
VASCULAR LAB PRELIMINARY  PRELIMINARY  PRELIMINARY  PRELIMINARY  Left lower extremity venous duplex completed.    Preliminary report: Positive for DVT age indeterminate noted in the distal posterior tibial vein coursing from the ankle to approximately 3 inches proximal. There are no comparison images available at this time from the 04/16/13 when the original DVTs were noted. There is an acute superficial thrombosis of the greater saphenous vein in the distal lower leg .  Gable Odonohue, RVS 12/22/2016, 1:53 PM
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
## 2016-12-22 NOTE — Discharge Instructions (Signed)
Please read instructions below. Begin taking  Xarelto, as directed. Stop taking aspirin, as this can increase your bleeding risk when taken with Xarelto. Schedule an appointment with your primary care provider, for follow up and management of your recurrent blood clots.  Return to the ER for shortness of breath, chest pain, or new or worsening symptoms.  Information on my medicine - XARELTO (rivaroxaban)  This medication education was reviewed with me or my healthcare representative as part of my discharge preparation.  The pharmacist that spoke with me during my hospital stay was:  Jamisha Hoeschen A, RPH  WHY WAS XARELTO PRESCRIBED FOR YOU? Xarelto was prescribed to treat blood clots that may have been found in the veins of your legs (deep vein thrombosis) or in your lungs (pulmonary embolism) and to reduce the risk of them occurring again.  What do you need to know about Xarelto? The starting dose is one 15 mg tablet taken TWICE daily with food for the FIRST 21 DAYS then on 01/12/17  the dose is changed to one 20 mg tablet taken ONCE A DAY with your evening meal.  DO NOT stop taking Xarelto without talking to the health care provider who prescribed the medication.  Refill your prescription for 20 mg tablets before you run out.  After discharge, you should have regular check-up appointments with your healthcare provider that is prescribing your Xarelto.  In the future your dose may need to be changed if your kidney function changes by a significant amount.  What do you do if you miss a dose? If you are taking Xarelto TWICE DAILY and you miss a dose, take it as soon as you remember. You may take two 15 mg tablets (total 30 mg) at the same time then resume your regularly scheduled 15 mg twice daily the next day.  If you are taking Xarelto ONCE DAILY and you miss a dose, take it as soon as you remember on the same day then continue your regularly scheduled once daily regimen the next day. Do  not take two doses of Xarelto at the same time.   Important Safety Information Xarelto is a blood thinner medicine that can cause bleeding. You should call your healthcare provider right away if you experience any of the following: ? Bleeding from an injury or your nose that does not stop. ? Unusual colored urine (red or dark brown) or unusual colored stools (red or black). ? Unusual bruising for unknown reasons. ? A serious fall or if you hit your head (even if there is no bleeding).  Some medicines may interact with Xarelto and might increase your risk of bleeding while on Xarelto. To help avoid this, consult your healthcare provider or pharmacist prior to using any new prescription or non-prescription medications, including herbals, vitamins, non-steroidal anti-inflammatory drugs (NSAIDs) and supplements.  This website has more information on Xarelto: [email or web address].xarelto.com.

## 2016-12-22 NOTE — ED Provider Notes (Signed)
Care assumed at shift change from [facility]ole Nadeau, PA-C, pending chem-8 results. Patient presenting with lower leg pain and swelling 3 days. Venous ultrasound done, showing DVT in the distal posterior tibial vein coursing to the ankle approximately 3 inches proximally, as well as an acute superficial thrombosis of the greater saphenous vein in the distal lower leg. Chem-8 pending for creatinine level. If Cr wnl, plan to start 15mg  Xarelto BID, with PCP (Dr.Rees) follow up on Monday.  3:20 PM Cr 0.90.  Pharmacy to educate patient on xarelto. First dose of xarelto given in ED.  Blood pressure 132/80, pulse 81, temperature 97.9 F (36.6 C), temperature source Oral, resp. rate 16, SpO2 99 %.  Pt is well-appearing, not in respiratory distress. Denies SOB and CP. Will send with Xarelto starter pack. Instructed patient to discontinue taking aspirin. Pt to follow up with PCP on Monday. Strict return precautions given.  Patient discussed with Dr. Silverio LayYao. Discussed results, findings, treatment and follow up. Patient advised of return precautions. Patient verbalized understanding and agreed with plan.    Russo, [place] N, PA-C 12/22/16 1526    Charlynne PanderYao, David Hsienta, MD 12/23/16 [phone]

## 2017-11-21 IMAGING — CR DG FOOT COMPLETE 3+V*L*
3 series · 3 of 3 positions shown · non-contrast
Comparison: No recent prior.

CLINICAL DATA: Injury.

EXAM:
LEFT FOOT - COMPLETE 3+ VIEW

[t foot ap left]
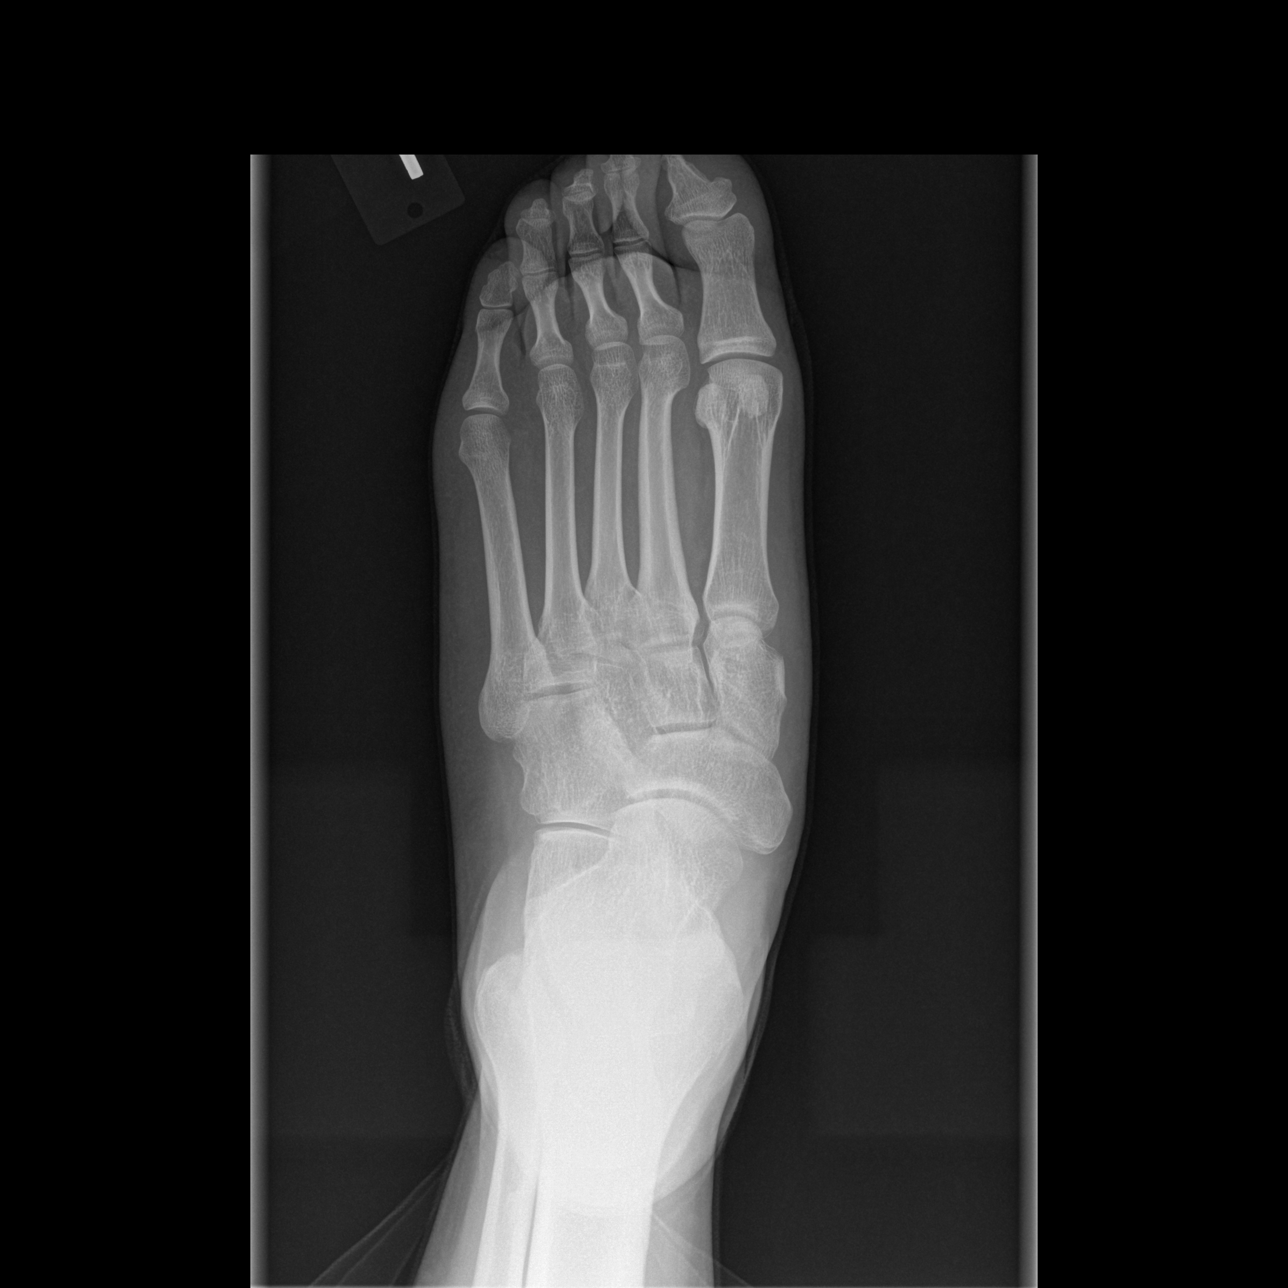

[t foot oblique left]
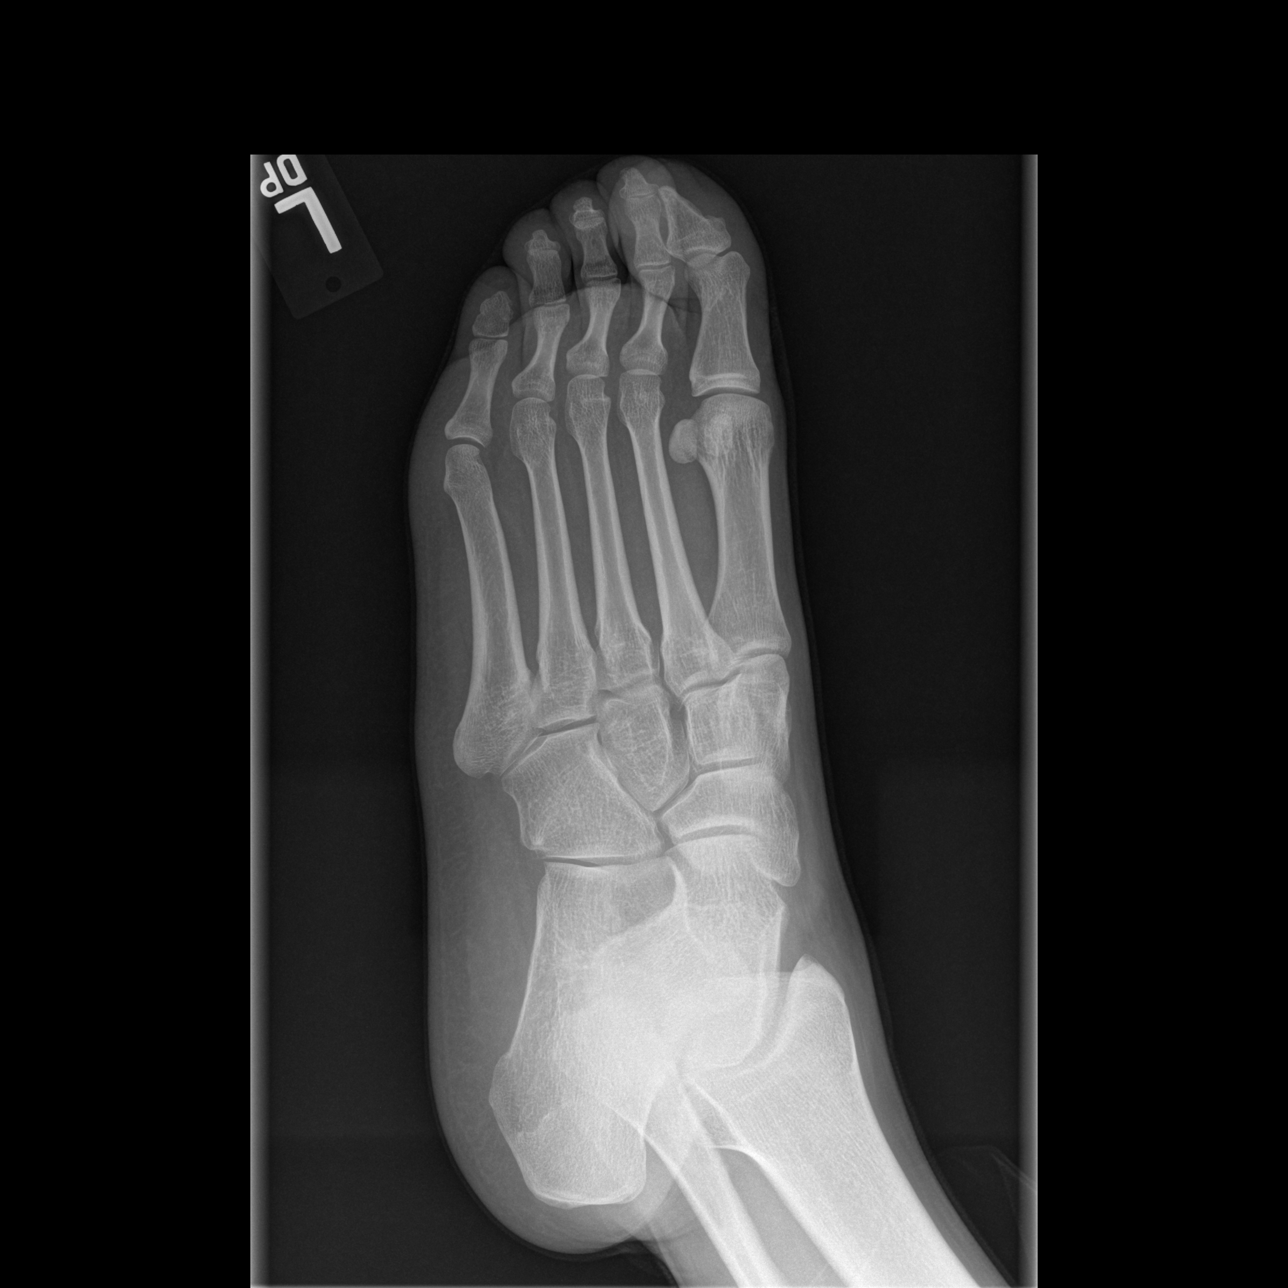

[t foot lat left]
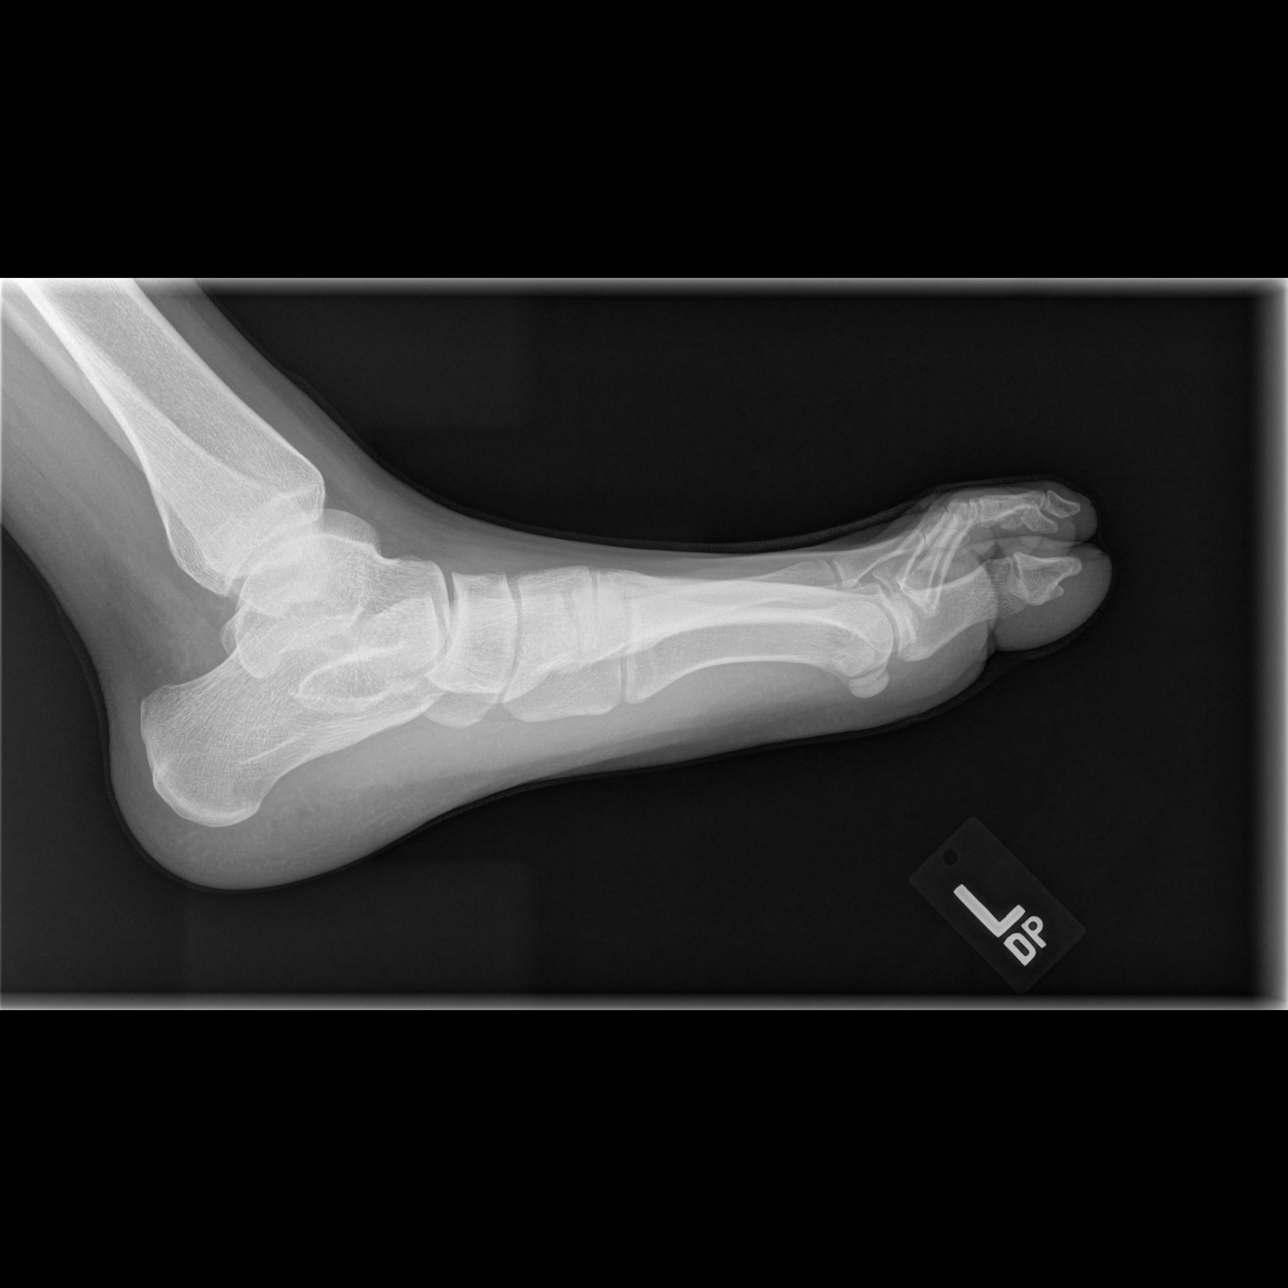

[3 of 3 positions shown; findings below may reference images not displayed]

FINDINGS: There is no evidence of fracture or dislocation. There is no
evidence of arthropathy or other focal bone abnormality. Soft
tissues are unremarkable.
IMPRESSION: Negative.
